# Patient Record
Sex: Male | Born: 1975 | Hispanic: No | Marital: Married | Smoking: Never smoker
Health system: Southern US, Community
[De-identification: ages and names within clinical notes are randomized; demographics above are authoritative.]

## PROBLEM LIST (undated history)

## (undated) DIAGNOSIS — G43909 Migraine, unspecified, not intractable, without status migrainosus: Secondary | ICD-10-CM

## (undated) HISTORY — PX: APPENDECTOMY: SHX54

## (undated) HISTORY — DX: Migraine, unspecified, not intractable, without status migrainosus: G43.909

---

## 2013-06-15 ENCOUNTER — Other Ambulatory Visit: Payer: Self-pay | Admitting: Internal Medicine

## 2013-06-15 ENCOUNTER — Ambulatory Visit
Admission: RE | Admit: 2013-06-15 | Discharge: 2013-06-15 | Disposition: A | Discharge status: Home / Self Care | Payer: No Typology Code available for payment source | Source: Ambulatory Visit | Attending: Internal Medicine | Admitting: Internal Medicine

## 2013-06-15 DIAGNOSIS — M545 Low back pain: Secondary | ICD-10-CM

## 2013-06-15 DIAGNOSIS — M79604 Pain in right leg: Secondary | ICD-10-CM

## 2014-10-10 IMAGING — CR DG LUMBAR SPINE COMPLETE 4+V
5 series · 5 of 5 positions shown · non-contrast
Comparison: None.

CLINICAL DATA: Low back pain without trauma.

EXAM:
LUMBAR SPINE - COMPLETE 4+ VIEW

[t l-spine a.p.]
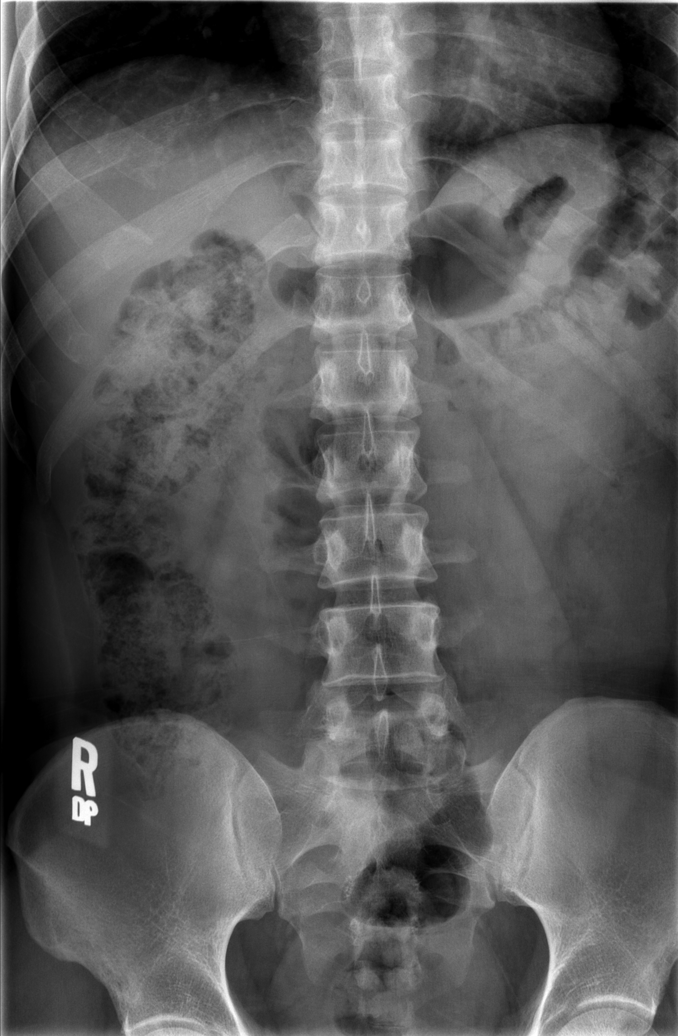

[t l-spine oblique exposure (1 of 2)]
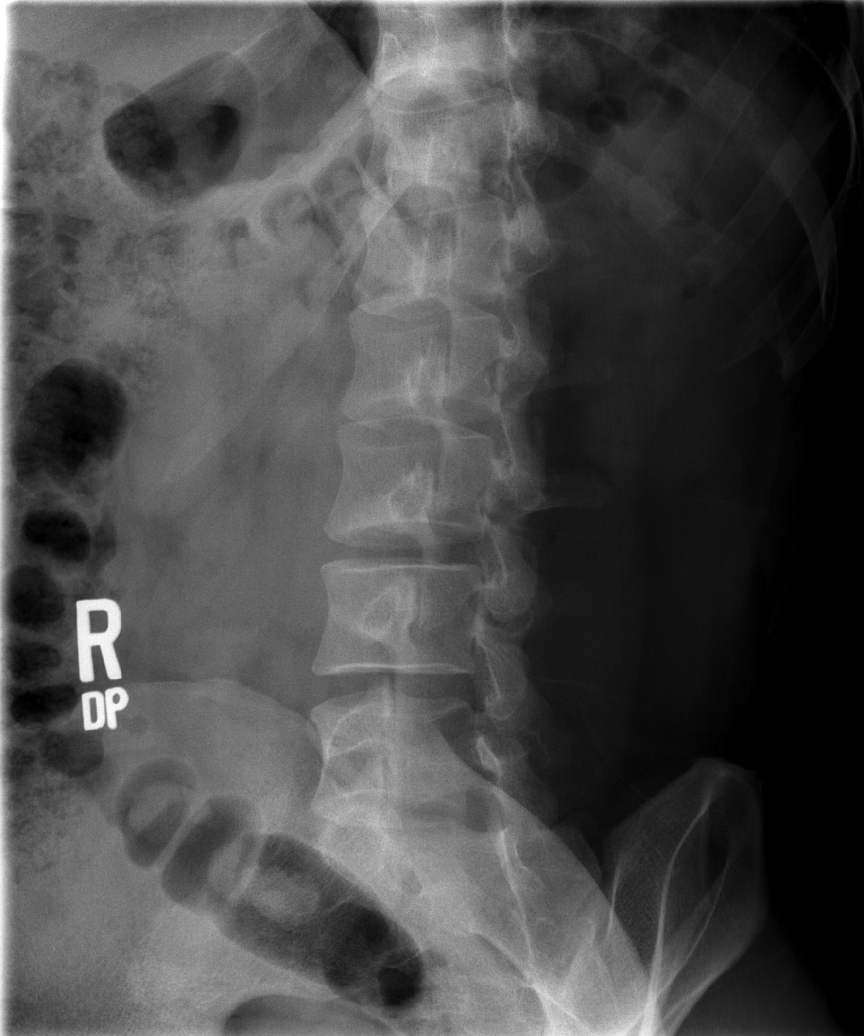

[t l-spine oblique exposure (2 of 2)]
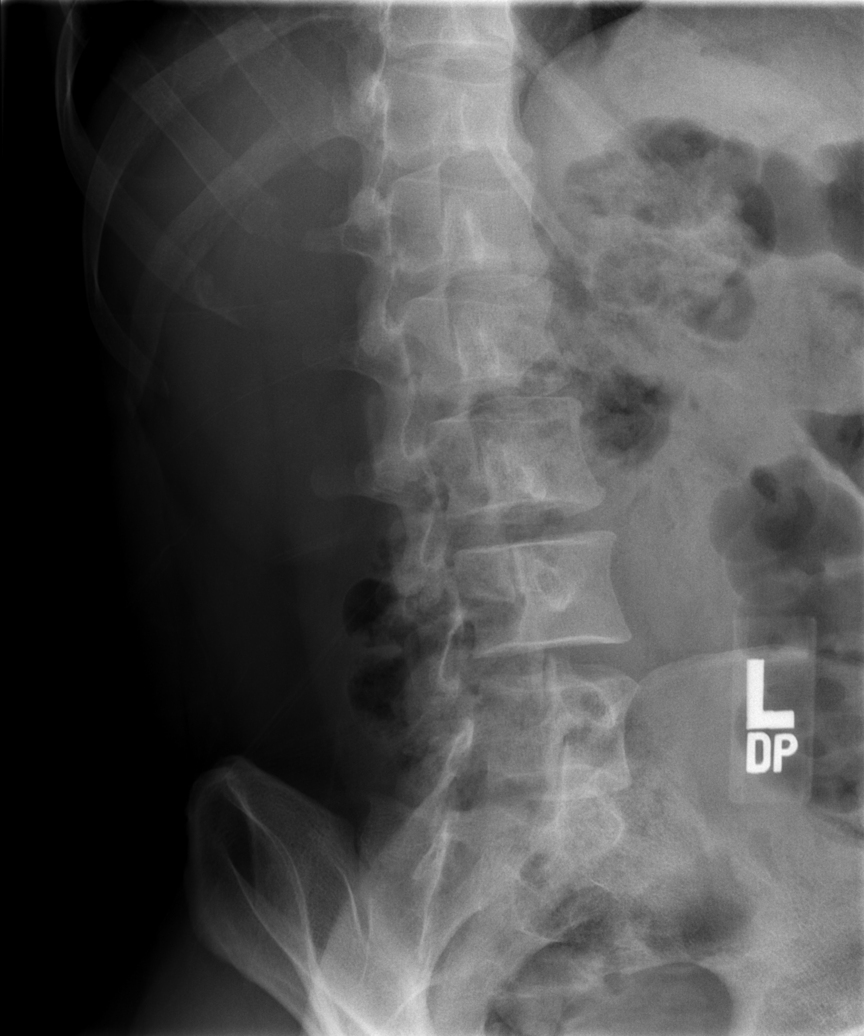

[t l-spine lat]
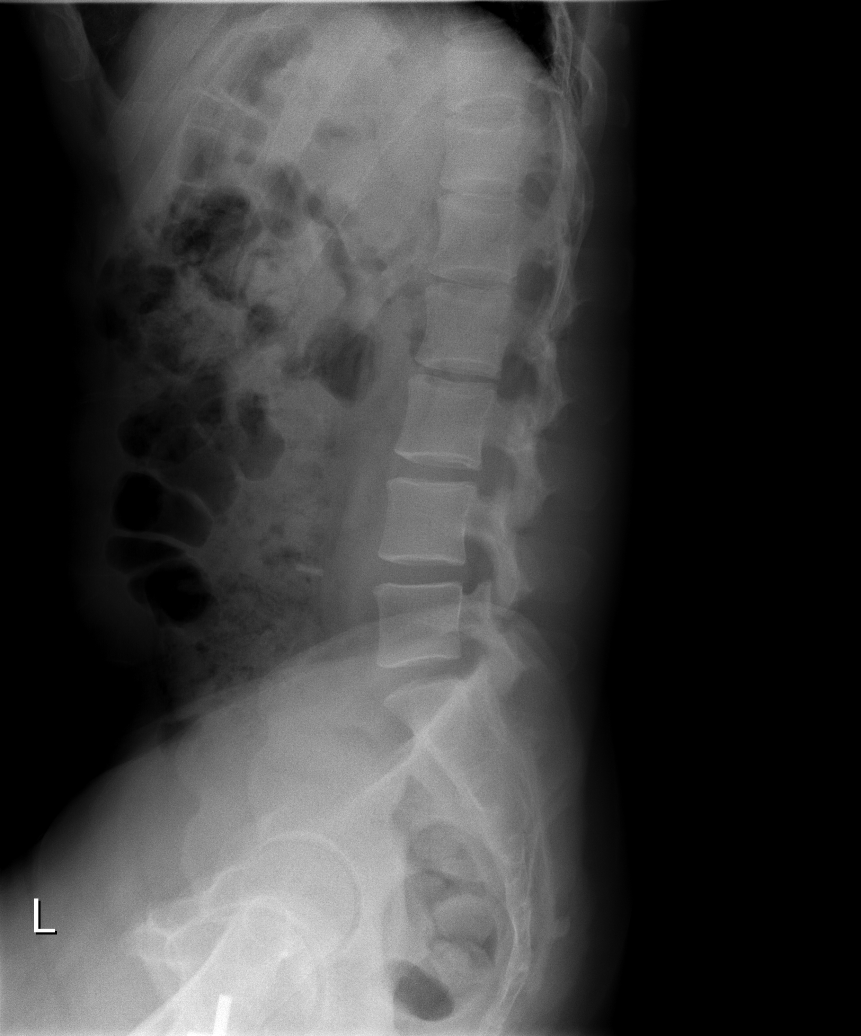

[t l-spine l5-s1 spot]
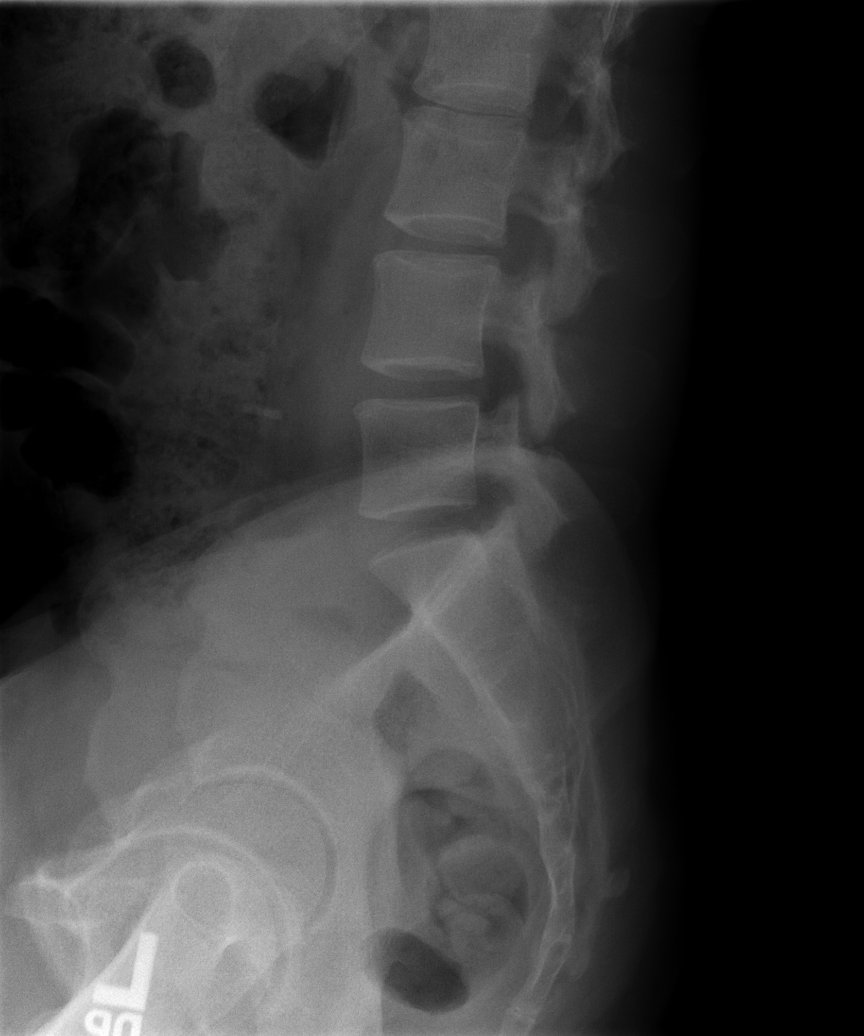

[5 of 5 positions shown; findings below may reference images not displayed]

FINDINGS: Five lumbar type vertebral bodies. Sacroiliac joints are symmetric.
Maintenance of vertebral body height and alignment. Intervertebral
disc heights are maintained.
IMPRESSION: No acute osseous abnormality.

## 2015-12-14 ENCOUNTER — Ambulatory Visit
Admission: RE | Admit: 2015-12-14 | Discharge: 2015-12-14 | Disposition: A | Discharge status: Home / Self Care | Payer: No Typology Code available for payment source | Source: Ambulatory Visit | Attending: Internal Medicine | Admitting: Internal Medicine

## 2015-12-14 ENCOUNTER — Other Ambulatory Visit: Payer: Self-pay | Admitting: Internal Medicine

## 2015-12-14 DIAGNOSIS — R0789 Other chest pain: Secondary | ICD-10-CM

## 2017-04-09 IMAGING — CR DG STERNUM 2+V
2 series · 2 of 2 positions shown · non-contrast
Comparison: None.

CLINICAL DATA: Mid sternal pain for 1 month, no injury

EXAM:
STERNUM - 2+ VIEW

[w chest pa *]
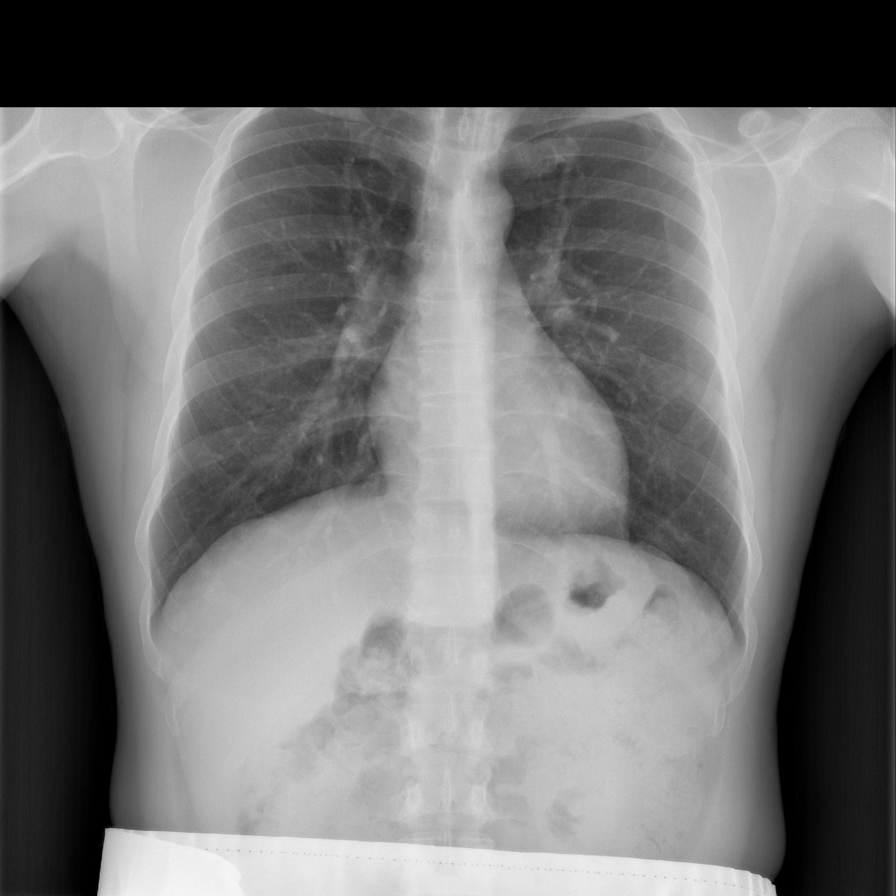

[w sternum lat *]
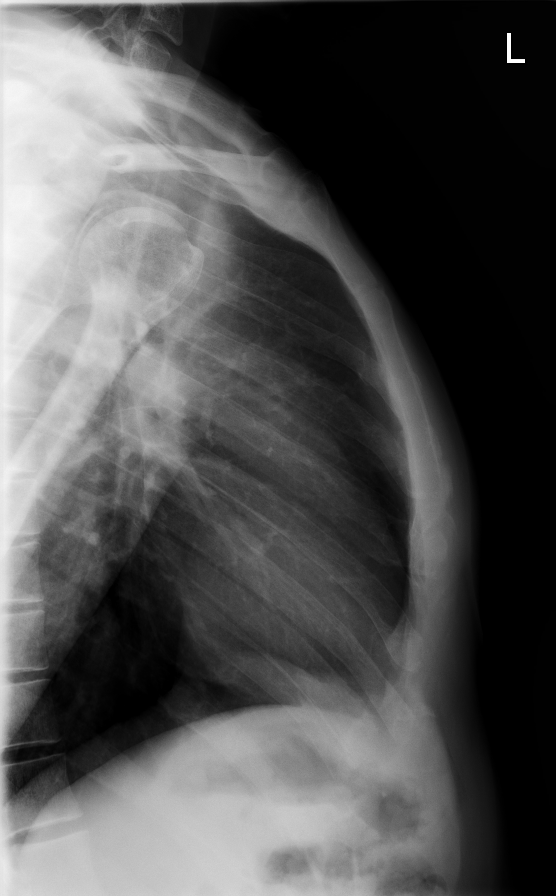

[2 of 2 positions shown; findings below may reference images not displayed]

FINDINGS: There is no evidence of fracture or other focal bone lesions. Mild
mid thoracic dextroscoliosis.
IMPRESSION: No sternal fracture is noted.  Mild mid thoracic dextroscoliosis.

## 2018-02-27 ENCOUNTER — Encounter: Payer: Self-pay | Admitting: Family Medicine

## 2018-02-27 ENCOUNTER — Ambulatory Visit (INDEPENDENT_AMBULATORY_CARE_PROVIDER_SITE_OTHER): Payer: 59 | Admitting: Family Medicine

## 2018-02-27 VITALS — BP 124/78 | HR 73 | Temp 98.4°F | Ht 66.0 in | Wt 114.8 lb

## 2018-02-27 DIAGNOSIS — Z52008 Unspecified donor, other blood: Secondary | ICD-10-CM | POA: Insufficient documentation

## 2018-02-27 DIAGNOSIS — Z13220 Encounter for screening for lipoid disorders: Secondary | ICD-10-CM

## 2018-02-27 DIAGNOSIS — Z0001 Encounter for general adult medical examination with abnormal findings: Secondary | ICD-10-CM | POA: Diagnosis not present

## 2018-02-27 DIAGNOSIS — J302 Other seasonal allergic rhinitis: Secondary | ICD-10-CM

## 2018-02-27 LAB — CBC
HEMATOCRIT: 38.3 % — AB (ref 39.0–52.0)
HEMOGLOBIN: 12.8 g/dL — AB (ref 13.0–17.0)
MCHC: 33.5 g/dL (ref 30.0–36.0)
MCV: 86.4 fl (ref 78.0–100.0)
Platelets: 306 10*3/uL (ref 150.0–400.0)
RBC: 4.43 Mil/uL (ref 4.22–5.81)
RDW: 13.3 % (ref 11.5–15.5)
WBC: 5.3 10*3/uL (ref 4.0–10.5)

## 2018-02-27 LAB — COMPREHENSIVE METABOLIC PANEL
ALK PHOS: 77 U/L (ref 39–117)
ALT: 29 U/L (ref 0–53)
AST: 23 U/L (ref 0–37)
Albumin: 4.7 g/dL (ref 3.5–5.2)
BUN: 13 mg/dL (ref 6–23)
CO2: 30 mEq/L (ref 19–32)
Calcium: 10.1 mg/dL (ref 8.4–10.5)
Chloride: 103 mEq/L (ref 96–112)
Creatinine, Ser: 0.88 mg/dL (ref 0.40–1.50)
GFR: 100.81 mL/min (ref >60.00)
Glucose, Bld: 116 mg/dL — ABNORMAL HIGH (ref 70–99)
Potassium: 4.9 mEq/L (ref 3.5–5.1)
Sodium: 140 mEq/L (ref 135–145)
TOTAL PROTEIN: 7.3 g/dL (ref 6.0–8.3)
Total Bilirubin: 0.5 mg/dL (ref 0.2–1.2)

## 2018-02-27 LAB — LIPID PANEL
Cholesterol: 242 mg/dL — ABNORMAL HIGH (ref 0–200)
HDL: 62.9 mg/dL (ref >39.00)
LDL Cholesterol: 157 mg/dL — ABNORMAL HIGH (ref 0–99)
NonHDL: 178.75
Total CHOL/HDL Ratio: 4
Triglycerides: 110 mg/dL (ref 0.0–149.0)
VLDL: 22 mg/dL (ref 0.0–40.0)

## 2018-02-27 LAB — TESTOSTERONE: Testosterone: 312.5 ng/dL (ref 300.00–890.00)

## 2018-02-27 LAB — TSH: TSH: 0.84 u[IU]/mL (ref 0.35–4.50)

## 2018-02-27 NOTE — Patient Instructions (Signed)
It was very nice to see you today!  Keep up the good work! You are doing an awesome job taking care of yourself. We will see you again in 1 year, or sooner as needed.   Take care, Dr Jerline Pain   Preventive Care 40-64 Years, Male Preventive care refers to lifestyle choices and visits with your health care provider that can promote health and wellness. What does preventive care include?  A yearly physical exam. This is also called an annual well check.  Dental exams once or twice a year.  Routine eye exams. Ask your health care provider how often you should have your eyes checked.  Personal lifestyle choices, including: ? Daily care of your teeth and gums. ? Regular physical activity. ? Eating a healthy diet. ? Avoiding tobacco and drug use. ? Limiting alcohol use. ? Practicing safe sex. ? Taking low-dose aspirin every day starting at age 10. What happens during an annual well check? The services and screenings done by your health care provider during your annual well check will depend on your age, overall health, lifestyle risk factors, and family history of disease. Counseling Your health care provider may ask you questions about your:  Alcohol use.  Tobacco use.  Drug use.  Emotional well-being.  Home and relationship well-being.  Sexual activity.  Eating habits.  Work and work Statistician.  Screening You may have the following tests or measurements:  Height, weight, and BMI.  Blood pressure.  Lipid and cholesterol levels. These may be checked every 5 years, or more frequently if you are over 3 years old.  Skin check.  Lung cancer screening. You may have this screening every year starting at age 96 if you have a 30-pack-year history of smoking and currently smoke or have quit within the past 15 years.  Fecal occult blood test (FOBT) of the stool. You may have this test every year starting at age 66.  Flexible sigmoidoscopy or colonoscopy. You may have a  sigmoidoscopy every 5 years or a colonoscopy every 10 years starting at age 68.  Prostate cancer screening. Recommendations will vary depending on your family history and other risks.  Hepatitis C blood test.  Hepatitis B blood test.  Sexually transmitted disease (STD) testing.  Diabetes screening. This is done by checking your blood sugar (glucose) after you have not eaten for a while (fasting). You may have this done every 1-3 years.  Discuss your test results, treatment options, and if necessary, the need for more tests with your health care provider. Vaccines Your health care provider may recommend certain vaccines, such as:  Influenza vaccine. This is recommended every year.  Tetanus, diphtheria, and acellular pertussis (Tdap, Td) vaccine. You may need a Td booster every 10 years.  Varicella vaccine. You may need this if you have not been vaccinated.  Zoster vaccine. You may need this after age 43.  Measles, mumps, and rubella (MMR) vaccine. You may need at least one dose of MMR if you were born in 1957 or later. You may also need a second dose.  Pneumococcal 13-valent conjugate (PCV13) vaccine. You may need this if you have certain conditions and have not been vaccinated.  Pneumococcal polysaccharide (PPSV23) vaccine. You may need one or two doses if you smoke cigarettes or if you have certain conditions.  Meningococcal vaccine. You may need this if you have certain conditions.  Hepatitis A vaccine. You may need this if you have certain conditions or if you travel or work in places  where you may be exposed to hepatitis A.  Hepatitis B vaccine. You may need this if you have certain conditions or if you travel or work in places where you may be exposed to hepatitis B.  Haemophilus influenzae type b (Hib) vaccine. You may need this if you have certain risk factors.  Talk to your health care provider about which screenings and vaccines you need and how often you need  them. This information is not intended to replace advice given to you by your health care provider. Make sure you discuss any questions you have with your health care provider. Document Released: 09/23/2015 Document Revised: 05/16/2016 Document Reviewed: 06/28/2015 Elsevier Interactive Patient Education  [employer].

## 2018-02-27 NOTE — Assessment & Plan Note (Addendum)
Stable.  Continue Allegra as needed. Check CBC, CMET and TSH

## 2018-02-27 NOTE — Progress Notes (Signed)
Subjective:  Arthur Hensley is a 42 y.o. male who presents today for his annual comprehensive physical exam and to establish care.   HPI:  He has no acute complaints today.   Lifestyle Diet: No specific diets. Vegetarian for the past 6 months.  Exercise: No specific exercises.   Depression screen PHQ 2/9 02/27/2018  Decreased Interest 0  Down, Depressed, Hopeless 0  PHQ - 2 Score 0   There are no preventive care reminders to display for this patient.  ROS: Positive for seasonal allergies, otherwise a complete review of systems was negative.   PMH:  The following were reviewed and entered/updated in epic: Past Medical History:  Diagnosis Date  . Migraines    Patient Active Problem List   Diagnosis Date Noted  . Seasonal allergies 02/27/2018  . Blood donor 02/27/2018   Past Surgical History:  Procedure Laterality Date  . APPENDECTOMY     Family History  Problem Relation Age of Onset  . Hypertension Mother   . Hypertension Father   . Depression Sister   . Cancer Neg Hx    Medications- reviewed and updated No current outpatient medications on file.   No current facility-administered medications for this visit.    Allergies-reviewed and updated No Known Allergies  Social History   Socioeconomic History  . Marital status: Married    Spouse name: Not on file  . Number of children: 2  . Years of education: Not on file  . Highest education level: Not on file  Occupational History  . Not on file  Social Needs  . Financial resource strain: Not on file  . Food insecurity:    Worry: Not on file    Inability: Not on file  . Transportation needs:    Medical: Not on file    Non-medical: Not on file  Tobacco Use  . Smoking status: Never Smoker  . Smokeless tobacco: Never Used  Substance and Sexual Activity  . Alcohol use: Yes    Comment: Rarely  . Drug use: Never  . Sexual activity: Yes    Partners: Female  Lifestyle  . Physical activity:    Days  per week: Not on file    Minutes per session: Not on file  . Stress: Not on file  Relationships  . Social connections:    Talks on phone: Not on file    Gets together: Not on file    Attends religious service: Not on file    Active member of club or organization: Not on file    Attends meetings of clubs or organizations: Not on file    Relationship status: Not on file  Other Topics Concern  . Not on file  Social History Narrative  . Not on file   Objective:  Physical Exam: BP 124/78 (BP Location: Left Arm, Patient Position: Sitting, Cuff Size: Normal)   Pulse 73   Temp 98.4 F (36.9 C) (Oral)   Ht 5\' 6"  (1.676 m)   Wt 114 lb 12.8 oz (52.1 kg)   SpO2 97%   BMI 18.53 kg/m   Body mass index is 18.53 kg/m. Wt Readings from Last 3 Encounters:  02/27/18 114 lb 12.8 oz (52.1 kg)  Gen: NAD, resting comfortably HEENT: TMs normal bilaterally. OP clear. No thyromegaly noted.  CV: RRR with no murmurs appreciated Pulm: NWOB, CTAB with no crackles, wheezes, or rhonchi GI: Normal bowel sounds present. Soft, Nontender, Nondistended. MSK: no edema, cyanosis, or clubbing noted Skin: warm, dry Neuro: CN2-12  grossly intact. Strength 5/5 in upper and lower extremities. Reflexes symmetric and intact bilaterally.  Psych: Normal affect and thought content  Assessment/Plan:  Seasonal allergies Stable.  Continue Allegra as needed. Check CBC, CMET and TSH   Preventative Healthcare: Check lipid panel.  Check testosterone.  Patient Counseling(The following topics were reviewed and/or handout was given):  -Nutrition: Stressed importance of moderation in sodium/caffeine intake, saturated fat and cholesterol, caloric balance, sufficient intake of fresh fruits, vegetables, and fiber.  -Stressed the importance of regular exercise.   -Substance Abuse: Discussed cessation/primary prevention of tobacco, alcohol, or other drug use; driving or other dangerous activities under the influence; availability  of treatment for abuse.   -Injury prevention: Discussed safety belts, safety helmets, smoke detector, smoking near bedding or upholstery.   -Sexuality: Discussed sexually transmitted diseases, partner selection, use of condoms, avoidance of unintended pregnancy and contraceptive alternatives.   -Dental health: Discussed importance of regular tooth brushing, flossing, and dental visits.  -Health maintenance and immunizations reviewed. Please refer to Health maintenance section.  Return to care in 1 year for next preventative visit.   Katina Degree. Arthur Ralph, MD 02/27/2018 9:41 AM

## 2018-02-28 ENCOUNTER — Encounter: Payer: Self-pay | Admitting: Family Medicine

## 2018-02-28 DIAGNOSIS — R739 Hyperglycemia, unspecified: Secondary | ICD-10-CM | POA: Insufficient documentation

## 2018-02-28 DIAGNOSIS — E785 Hyperlipidemia, unspecified: Secondary | ICD-10-CM | POA: Insufficient documentation

## 2018-02-28 DIAGNOSIS — D649 Anemia, unspecified: Secondary | ICD-10-CM | POA: Insufficient documentation

## 2018-11-06 ENCOUNTER — Encounter: Payer: Self-pay | Admitting: Family Medicine

## 2018-11-06 ENCOUNTER — Ambulatory Visit (INDEPENDENT_AMBULATORY_CARE_PROVIDER_SITE_OTHER): Payer: 59 | Admitting: Family Medicine

## 2018-11-06 VITALS — BP 124/78 | HR 71 | Temp 97.8°F | Ht 66.0 in

## 2018-11-06 DIAGNOSIS — E785 Hyperlipidemia, unspecified: Secondary | ICD-10-CM

## 2018-11-06 DIAGNOSIS — J329 Chronic sinusitis, unspecified: Secondary | ICD-10-CM | POA: Diagnosis not present

## 2018-11-06 DIAGNOSIS — Z23 Encounter for immunization: Secondary | ICD-10-CM | POA: Diagnosis not present

## 2018-11-06 MED ORDER — AZITHROMYCIN 250 MG PO TABS: ORAL_TABLET | ORAL | 0 refills | Status: AC

## 2018-11-06 MED ORDER — IPRATROPIUM BROMIDE 0.06 % NA SOLN: 2.0000 | Freq: Four times a day (QID) | NASAL | 0 refills | Status: AC

## 2018-11-06 NOTE — Progress Notes (Signed)
   Chief Complaint:  Arthur Hensley is a 43 y.o. male who presents for same day appointment with a chief complaint of sore throat.   Assessment/Plan:  Sinusitis Likely secondary to viral URI. No signs of bacterial infection. Start atrovent for rhinorrhea/sinus congestion.Sent in a "pocket prescription" for azithromycin with strict instruction to not start unless symptoms worsen or fail to improve within the next several days. Recommended tylenol and/or motrin as needed for low grade fever and pain. Encouraged good oral hydration. Return precautions reviewed. Follow up as needed.   Dyslipidemia Discussed lifestyle modifications. Recheck with next blood draw.   Preventative Healthcare Flu shot given today.      Subjective:  HPI:  Sore Throat, acute problem Started a couple of weeks ago. Symptoms are stable. Associated with cough. Wife has been sick with similar symptoms.  No fevers or chills.  No specific treatments tried.  No other obvious alleviating or aggravating factors.   ROS: Per HPI  PMH: He reports that he has never smoked. He has never used smokeless tobacco. He reports current alcohol use. He reports that he does not use drugs.      Objective:  Physical Exam: BP 124/78 (BP Location: Left Arm, Patient Position: Sitting, Cuff Size: Normal)   Pulse 71   Temp 97.8 F (36.6 C) (Oral)   Ht 5\' 6"  (1.676 m)   SpO2 98%   BMI 18.53 kg/m   Gen: NAD, resting comfortably HEENT: TMs with clear effusion bilaterally. OP with slight erythema. Nasal mucosa erythematous and boggy with clear discharge. Decreased maxillary transillumination bilaterally.  CV: Regular rate and rhythm with no murmurs appreciated Pulm: Normal work of breathing, clear to auscultation bilaterally with no crackles, wheezes, or rhonchi      Arthur Hensley M. Jimmey Ralph, MD 11/06/2018 9:32 AM

## 2018-11-06 NOTE — Patient Instructions (Addendum)
Start the atrovent.  Start the zpack if your symptoms worsen or do not improve in a few days.  Please stay well hydrated.  You can take tylenol and/or motrin as needed for low grade fever and pain.  Please let me know if your symptoms worsen or fail to improve.  Take care, Dr Jimmey Ralph   Eat at least 3 REAL meals and 1-2 snacks per day.  Aim for no more than 5 hours between eating.  Eat breakfast within one hour of getting up.    Obtain twice as many fruits/vegetables as protein or carbohydrate foods for both lunch and dinner.   Cut down on sweet beverages. This includes juice, soda, and sweet tea.    Exercise at least 150 minutes every week.

## 2018-11-06 NOTE — Assessment & Plan Note (Signed)
Discussed lifestyle modifications. Recheck with next blood draw.

## 2019-03-02 ENCOUNTER — Encounter: Payer: 59 | Admitting: Family Medicine

## 2022-06-04 ENCOUNTER — Encounter: Payer: Self-pay | Admitting: *Deleted

## 2022-08-23 ENCOUNTER — Encounter: Payer: Self-pay | Admitting: *Deleted
